# Patient Record
Sex: Male | Born: 2000 | Race: White | Hispanic: Yes | Marital: Single | Smoking: Never smoker
Health system: Southern US, Community
[De-identification: ages and names within clinical notes are randomized; demographics above are authoritative.]

## PROBLEM LIST (undated history)

## (undated) DIAGNOSIS — K3580 Unspecified acute appendicitis: Secondary | ICD-10-CM

## (undated) DIAGNOSIS — J45909 Unspecified asthma, uncomplicated: Secondary | ICD-10-CM

## (undated) DIAGNOSIS — J309 Allergic rhinitis, unspecified: Secondary | ICD-10-CM

## (undated) HISTORY — DX: Unspecified acute appendicitis: K35.80

## (undated) HISTORY — DX: Unspecified asthma, uncomplicated: J45.909

## (undated) HISTORY — PX: APPENDECTOMY: SHX54

## (undated) HISTORY — DX: Allergic rhinitis, unspecified: J30.9

---
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
## 2022-02-06 ENCOUNTER — Ambulatory Visit: Payer: Medicaid Other | Admitting: Pediatrics

## 2022-02-19 DIAGNOSIS — R051 Acute cough: Secondary | ICD-10-CM | POA: Diagnosis not present

## 2022-02-19 DIAGNOSIS — J209 Acute bronchitis, unspecified: Secondary | ICD-10-CM | POA: Diagnosis not present

## 2022-03-06 ENCOUNTER — Ambulatory Visit: Payer: Medicaid Other | Admitting: Student

## 2022-04-20 ENCOUNTER — Ambulatory Visit
Admission: RE | Admit: 2022-04-20 | Discharge: 2022-04-20 | Disposition: A | Discharge status: Home / Self Care | Payer: Medicaid Other | Source: Ambulatory Visit | Attending: Urgent Care | Admitting: Urgent Care

## 2022-04-20 VITALS — BP 133/68 | HR 68 | Temp 97.9°F | Resp 20

## 2022-04-20 DIAGNOSIS — J309 Allergic rhinitis, unspecified: Secondary | ICD-10-CM

## 2022-04-20 DIAGNOSIS — M542 Cervicalgia: Secondary | ICD-10-CM

## 2022-04-20 DIAGNOSIS — R070 Pain in throat: Secondary | ICD-10-CM | POA: Insufficient documentation

## 2022-04-20 LAB — POCT RAPID STREP A (OFFICE): Rapid Strep A Screen: NEGATIVE

## 2022-04-20 LAB — POCT MONO SCREEN (KUC): Mono, POC: NEGATIVE

## 2022-04-20 MED ORDER — PSEUDOEPHEDRINE HCL 30 MG PO TABS: 30.0000 mg | ORAL_TABLET | Freq: Three times a day (TID) | ORAL | 0 refills | Status: AC | PRN

## 2022-04-20 MED ORDER — NAPROXEN 375 MG PO TABS: 375.0000 mg | ORAL_TABLET | Freq: Two times a day (BID) | ORAL | 0 refills | Status: AC

## 2022-04-20 MED ORDER — CETIRIZINE HCL 10 MG PO TABS: 10.0000 mg | ORAL_TABLET | Freq: Every day | ORAL | 0 refills | Status: AC

## 2022-04-20 MED ORDER — TIZANIDINE HCL 4 MG PO TABS: 4.0000 mg | ORAL_TABLET | Freq: Every day | ORAL | 0 refills | Status: AC

## 2022-04-20 NOTE — ED Provider Notes (Signed)
[facility]   MRN: [id number] DOB: Jun 09, 2001  Subjective:   Dennis Clarke is a 21 y.o. male presenting for 1 week history of persistent throat pain, painful swallowing, neck pain.  No fever, runny or stuffy nose, ear pain, cough, chest pain, shortness of breath or wheezing, rashes, body pains.  Patient has a history of allergic rhinitis and asthma.  He is not a smoker.   Current Facility-Administered Medications:    ibuprofen (ADVIL) tablet 400 mg, 400 mg, Oral, Q6H PRN, Phillippe, Holly, MD  Current Outpatient Medications:    albuterol (VENTOLIN HFA) 108 (90 Base) MCG/ACT inhaler, Inhale 2 puffs into the lungs every 4 (four) hours as needed for wheezing or shortness of breath., Disp: 8 g, Rfl: 2   cetirizine (ZYRTEC) 10 MG tablet, Take 1 tablet (10 mg total) by mouth daily., Disp: 30 tablet, Rfl: 6   Cholecalciferol (VITAMIN D) 2000 units CAPS, Take 1 capsule (2,000 Units total) by mouth daily. Start after 6 weeks of high dose Vitamin D, Disp: 31 capsule, Rfl: 3   fluticasone (FLONASE) 50 MCG/ACT nasal spray, Place 2 sprays into both nostrils daily., Disp: 16 g, Rfl: 12   ibuprofen (ADVIL,MOTRIN) 200 MG tablet, Take 200 mg by mouth every 6 (six) hours as needed., Disp: , Rfl:    Vitamin D, Ergocalciferol, (DRISDOL) 50000 units CAPS capsule, Take 1 capsule (50,000 Units total) by mouth every 7 (seven) days. (Patient not taking: Reported on 12/03/2021), Disp: 6 capsule, Rfl: 0   No Known Allergies  Past Medical History:  Diagnosis Date   Abdominal pain 05/29/2017   Acute appendicitis 10/23/2014   Adjustment disorder with mixed anxiety and depressed mood 06/16/2019   Allergic rhinitis 08/24/2013   Avulsion fracture of bone 05/15/2016   Community acquired pneumonia 09/05/2017   Cough 09/05/2017   Mood disturbance 05/31/2019   Poor sleep hygiene 06/29/2018   Sebaceous cyst of axilla 01/18/2021   Tension headache 05/29/2017   Testicular mass 03/30/2019    Unspecified asthma(493.90) 08/24/2013   Wheezing 09/05/2017     Past Surgical History:  Procedure Laterality Date   APPENDECTOMY N/A    Phreesia 06/18/2020   LAPAROSCOPIC APPENDECTOMY N/A 10/23/2014   Procedure: APPENDECTOMY LAPAROSCOPIC;  Surgeon: Judie Petit. Leonia Corona, MD;  Location: [facility] OR;  Service: Pediatrics;  Laterality: N/A;    Family History  Problem Relation Age of Onset   Diabetes Maternal Uncle     Social History   Tobacco Use   Smoking status: Never   Smokeless tobacco: Never    ROS   Objective:   Vitals: BP 133/68   Pulse 68   Temp 97.9 F (36.6 C)   Resp 20   SpO2 98%   Physical Exam Constitutional:      General: He is not in acute distress.    Appearance: Normal appearance. He is normal weight. He is not ill-appearing, toxic-appearing or diaphoretic.  HENT:     Head: Normocephalic and atraumatic.     Right Ear: Tympanic membrane, ear canal and external ear normal. There is no impacted cerumen.     Left Ear: Tympanic membrane, ear canal and external ear normal. There is no impacted cerumen.     Nose: Nose normal. No congestion or rhinorrhea.     Mouth/Throat:     Mouth: Mucous membranes are moist.     Pharynx: Pharyngeal swelling (chronic tonsillar hypertrophy) present. No oropharyngeal exudate, posterior oropharyngeal erythema or uvula swelling.     Tonsils: No tonsillar  exudate or tonsillar abscesses. 0 on the right. 0 on the left.  Eyes:     General: No scleral icterus.       Right eye: No discharge.        Left eye: No discharge.     Extraocular Movements: Extraocular movements intact.     Conjunctiva/sclera: Conjunctivae normal.  Neck:     Comments: Negative Kernig and Brudzinski. Cardiovascular:     Rate and Rhythm: Normal rate.  Pulmonary:     Effort: Pulmonary effort is normal.  Musculoskeletal:     Cervical back: Normal range of motion and neck supple. No rigidity or tenderness. No muscular tenderness.  Lymphadenopathy:     Head:      Right side of head: No submental, submandibular, tonsillar, preauricular, posterior auricular or occipital adenopathy.     Left side of head: No submental, submandibular, tonsillar, preauricular, posterior auricular or occipital adenopathy.     Cervical: No cervical adenopathy.     Right cervical: No superficial, deep or posterior cervical adenopathy.    Left cervical: No superficial, deep or posterior cervical adenopathy.     Upper Body:     Right upper body: No supraclavicular adenopathy.     Left upper body: No supraclavicular adenopathy.  Neurological:     General: No focal deficit present.     Mental Status: He is alert and oriented to person, place, and time.  Psychiatric:        Mood and Affect: Mood normal.        Behavior: Behavior normal.     Results for orders placed or performed during the hospital encounter of 04/20/22 (from the past 24 hour(s))  POCT rapid strep A     Status: None   Collection Time: 04/20/22  1:23 PM  Result Value Ref Range   Rapid Strep A Screen Negative Negative  POCT mono screen     Status: None   Collection Time: 04/20/22  1:37 PM  Result Value Ref Range   Mono, POC Negative Negative    Assessment and Plan :   PDMP not reviewed this encounter.  1. Throat pain   2. Neck pain   3. Allergic rhinitis, unspecified seasonality, unspecified trigger    Patient's primary concern was meningitis after he did a Therapist, art and this came out.  I reassured patient that I have very low suspicion for him having meningitis.  Recommended conservative management, supportive care.  Strep culture pending. Counseled patient on potential for adverse effects with medications prescribed/recommended today, ER and return-to-clinic precautions discussed, patient verbalized understanding.    Wallis Bamberg, PA-C 04/20/22 1426

## 2022-04-20 NOTE — ED Triage Notes (Signed)
Pt here with neck pain and sore throat x 1 week.

## 2022-04-22 LAB — CULTURE, GROUP A STREP (THRC)

## 2022-04-30 ENCOUNTER — Ambulatory Visit
Admission: RE | Admit: 2022-04-30 | Discharge: 2022-04-30 | Disposition: A | Discharge status: Home / Self Care | Payer: Medicaid Other | Source: Ambulatory Visit | Attending: Urgent Care | Admitting: Urgent Care

## 2022-04-30 VITALS — BP 124/78 | HR 74 | Temp 97.8°F | Resp 20

## 2022-04-30 DIAGNOSIS — H938X3 Other specified disorders of ear, bilateral: Secondary | ICD-10-CM

## 2022-04-30 DIAGNOSIS — J309 Allergic rhinitis, unspecified: Secondary | ICD-10-CM

## 2022-04-30 DIAGNOSIS — R070 Pain in throat: Secondary | ICD-10-CM

## 2022-04-30 DIAGNOSIS — R0982 Postnasal drip: Secondary | ICD-10-CM

## 2022-04-30 DIAGNOSIS — J018 Other acute sinusitis: Secondary | ICD-10-CM | POA: Diagnosis not present

## 2022-04-30 MED ORDER — AMOXICILLIN-POT CLAVULANATE 875-125 MG PO TABS: 1.0000 | ORAL_TABLET | Freq: Two times a day (BID) | ORAL | 0 refills | Status: DC

## 2022-04-30 NOTE — ED Provider Notes (Signed)
[facility]   MRN: [id number] DOB: [birth year]  Subjective:   Dennis Clarke is a 21 y.o. male presenting for recheck on persistent throat pain.  Patient has also had painful swallowing, sinus fullness and pain, bilateral ear fullness.  Has a history of allergic rhinitis and has been taking Flonase, Zyrtec, pseudoephedrine.  Has had a mild cough.  No chest pain, shortness of breath or wheezing.   Current Facility-Administered Medications:    ibuprofen (ADVIL) tablet 400 mg, 400 mg, Oral, Q6H PRN, Phillippe, Holly, MD  Current Outpatient Medications:    albuterol (VENTOLIN HFA) 108 (90 Base) MCG/ACT inhaler, Inhale 2 puffs into the lungs every 4 (four) hours as needed for wheezing or shortness of breath., Disp: 8 g, Rfl: 2   cetirizine (ZYRTEC) 10 MG tablet, Take 1 tablet (10 mg total) by mouth daily., Disp: 30 tablet, Rfl: 0   Cholecalciferol (VITAMIN D) 2000 units CAPS, Take 1 capsule (2,000 Units total) by mouth daily. Start after 6 weeks of high dose Vitamin D, Disp: 31 capsule, Rfl: 3   fluticasone (FLONASE) 50 MCG/ACT nasal spray, Place 2 sprays into both nostrils daily., Disp: 16 g, Rfl: 12   ibuprofen (ADVIL,MOTRIN) 200 MG tablet, Take 200 mg by mouth every 6 (six) hours as needed., Disp: , Rfl:    naproxen (NAPROSYN) 375 MG tablet, Take 1 tablet (375 mg total) by mouth 2 (two) times daily with a meal., Disp: 30 tablet, Rfl: 0   pseudoephedrine (SUDAFED) 30 MG tablet, Take 1 tablet (30 mg total) by mouth every 8 (eight) hours as needed for congestion., Disp: 30 tablet, Rfl: 0   tiZANidine (ZANAFLEX) 4 MG tablet, Take 1 tablet (4 mg total) by mouth at bedtime., Disp: 30 tablet, Rfl: 0   Vitamin D, Ergocalciferol, (DRISDOL) 50000 units CAPS capsule, Take 1 capsule (50,000 Units total) by mouth every 7 (seven) days. (Patient not taking: Reported on 12/03/2021), Disp: 6 capsule, Rfl: 0   No Known Allergies  Past Medical History:  Diagnosis Date    Abdominal pain 05/29/2017   Acute appendicitis 10/23/2014   Adjustment disorder with mixed anxiety and depressed mood 06/16/2019   Allergic rhinitis 08/24/2013   Avulsion fracture of bone 05/15/2016   Community acquired pneumonia 09/05/2017   Cough 09/05/2017   Mood disturbance 05/31/2019   Poor sleep hygiene 06/29/2018   Sebaceous cyst of axilla 01/18/2021   Tension headache 05/29/2017   Testicular mass 03/30/2019   Unspecified asthma(493.90) 08/24/2013   Wheezing 09/05/2017     Past Surgical History:  Procedure Laterality Date   APPENDECTOMY N/A    Phreesia 06/18/2020   LAPAROSCOPIC APPENDECTOMY N/A 10/23/2014   Procedure: APPENDECTOMY LAPAROSCOPIC;  Surgeon: Judie Petit. Leonia Corona, MD;  Location: [facility] OR;  Service: Pediatrics;  Laterality: N/A;    Family History  Problem Relation Age of Onset   Diabetes Maternal Uncle     Social History   Tobacco Use   Smoking status: Never   Smokeless tobacco: Never    ROS   Objective:   Vitals: BP 124/78   Pulse 74   Temp 97.8 F (36.6 C) (Oral)   Resp 20   SpO2 98%   Physical Exam Constitutional:      General: He is not in acute distress.    Appearance: Normal appearance. He is well-developed and normal weight. He is not ill-appearing, toxic-appearing or diaphoretic.  HENT:     Head: Normocephalic and atraumatic.     Right Ear: Tympanic membrane, ear  canal and external ear normal. There is no impacted cerumen.     Left Ear: Tympanic membrane, ear canal and external ear normal. There is no impacted cerumen.     Nose: Congestion present. No rhinorrhea.     Mouth/Throat:     Mouth: Mucous membranes are moist.     Pharynx: No oropharyngeal exudate or posterior oropharyngeal erythema.     Comments: Significant postnasal drainage overlying pharynx. Eyes:     General: No scleral icterus.       Right eye: No discharge.        Left eye: No discharge.     Extraocular Movements: Extraocular movements intact.     Conjunctiva/sclera:  Conjunctivae normal.  Cardiovascular:     Rate and Rhythm: Normal rate.  Pulmonary:     Effort: Pulmonary effort is normal.  Musculoskeletal:     Cervical back: Normal range of motion and neck supple. No rigidity. No muscular tenderness.  Skin:    General: Skin is warm and dry.  Neurological:     General: No focal deficit present.     Mental Status: He is alert and oriented to person, place, and time.  Psychiatric:        Mood and Affect: Mood normal.        Behavior: Behavior normal.        Thought Content: Thought content normal.        Judgment: Judgment normal.     Assessment and Plan :   PDMP not reviewed this encounter.  1. Acute non-recurrent sinusitis of other sinus   2. Post-nasal drainage   3. Throat pain   4. Ear fullness, bilateral   5. Allergic rhinitis, unspecified seasonality, unspecified trigger    Will start empiric treatment for sinusitis with Augmentin.  Recommended supportive care otherwise including the use of oral antihistamine, decongestant.  Hold Flonase for now.    If patient's symptoms persist in the next 2 to 3 days, recommend oral prednisone course to address his allergic rhinitis.  Counseled patient on potential for adverse effects with medications prescribed/recommended today, ER and return-to-clinic precautions discussed, patient verbalized understanding.    Wallis Bamberg, [place] 04/30/22 1545

## 2022-04-30 NOTE — ED Triage Notes (Signed)
Pt here with persistent sore throat x 3 weeks.

## 2022-06-03 DIAGNOSIS — J029 Acute pharyngitis, unspecified: Secondary | ICD-10-CM | POA: Diagnosis not present

## 2022-11-06 DIAGNOSIS — R509 Fever, unspecified: Secondary | ICD-10-CM | POA: Diagnosis not present

## 2022-11-06 DIAGNOSIS — J069 Acute upper respiratory infection, unspecified: Secondary | ICD-10-CM | POA: Diagnosis not present

## 2023-05-06 DIAGNOSIS — J029 Acute pharyngitis, unspecified: Secondary | ICD-10-CM | POA: Diagnosis not present

## 2023-05-06 DIAGNOSIS — R051 Acute cough: Secondary | ICD-10-CM | POA: Diagnosis not present

## 2023-05-06 DIAGNOSIS — B349 Viral infection, unspecified: Secondary | ICD-10-CM | POA: Diagnosis not present

## 2023-06-09 DIAGNOSIS — R55 Syncope and collapse: Secondary | ICD-10-CM | POA: Diagnosis not present

## 2023-06-09 DIAGNOSIS — S0990XA Unspecified injury of head, initial encounter: Secondary | ICD-10-CM | POA: Diagnosis not present

## 2023-08-03 ENCOUNTER — Ambulatory Visit
Admission: EM | Admit: 2023-08-03 | Discharge: 2023-08-03 | Disposition: A | Discharge status: Home / Self Care | Payer: Medicaid Other | Attending: Internal Medicine | Admitting: Internal Medicine

## 2023-08-03 DIAGNOSIS — J029 Acute pharyngitis, unspecified: Secondary | ICD-10-CM | POA: Insufficient documentation

## 2023-08-03 DIAGNOSIS — Z20828 Contact with and (suspected) exposure to other viral communicable diseases: Secondary | ICD-10-CM | POA: Insufficient documentation

## 2023-08-03 LAB — POCT MONO SCREEN (KUC): Mono, POC: NEGATIVE

## 2023-08-03 LAB — POC COVID19/FLU A&B COMBO
Covid Antigen, POC: NEGATIVE
Influenza A Antigen, POC: NEGATIVE
Influenza B Antigen, POC: NEGATIVE

## 2023-08-03 LAB — POCT RAPID STREP A (OFFICE): Rapid Strep A Screen: NEGATIVE

## 2023-08-03 MED ORDER — AMOXICILLIN 500 MG PO CAPS: 500.0000 mg | ORAL_CAPSULE | Freq: Two times a day (BID) | ORAL | 0 refills | Status: AC

## 2023-08-03 NOTE — ED Triage Notes (Signed)
Pt presents to UC w/ c/o sore throat x2 weeks.  Home remedies: cough drops, cold medicine

## 2023-08-03 NOTE — ED Provider Notes (Signed)
[facility]    CSN: [id number] Arrival date & time: 08/03/23  1153      History   Chief Complaint No chief complaint on file.   HPI Dennis Clarke is a 22 y.o. male  presents for evaluation of URI symptoms for 2 weeks. Patient reports associated symptoms of sore throat with some throat congestion, and fatigue.  Denies N/V/D, fever, cough, ear pain, body aches, shortness of breath. Patient does have a hx of asthma.  Denies any wheezing or shortness of breath and has not needed to use his albuterol inhaler.  Patient does not at have a history of smoking.  Reports  sick contacts via girlfriend who has the flu.  Pt has taken cold medicine OTC for symptoms. Pt has no other concerns at this time.   HPI  Past Medical History:  Diagnosis Date   Abdominal pain 05/29/2017   Acute appendicitis 10/23/2014   Adjustment disorder with mixed anxiety and depressed mood 06/16/2019   Allergic rhinitis 08/24/2013   Avulsion fracture of bone 05/15/2016   Community acquired pneumonia 09/05/2017   Cough 09/05/2017   Mood disturbance 05/31/2019   Poor sleep hygiene 06/29/2018   Sebaceous cyst of axilla 01/18/2021   Tension headache 05/29/2017   Testicular mass 03/30/2019   Unspecified asthma(493.90) 08/24/2013   Wheezing 09/05/2017    Patient Active Problem List   Diagnosis Date Noted   Intermittent asthma 05/29/2017   Anxiety state 05/29/2017   Allergic rhinitis 08/24/2013    Past Surgical History:  Procedure Laterality Date   APPENDECTOMY N/A    Phreesia 06/18/2020   LAPAROSCOPIC APPENDECTOMY N/A 10/23/2014   Procedure: APPENDECTOMY LAPAROSCOPIC;  Surgeon: Judie Petit. Leonia Corona, MD;  Location: [facility] OR;  Service: Pediatrics;  Laterality: N/A;       Home Medications    Prior to Admission medications   Medication Sig Start Date End Date Taking? Authorizing Provider  amoxicillin (AMOXIL) 500 MG capsule Take 1 capsule (500 mg total) by mouth 2 (two) times daily for 10 days.  08/03/23 08/13/23 Yes Radford Pax, NP  albuterol (VENTOLIN HFA) 108 (90 Base) MCG/ACT inhaler Inhale 2 puffs into the lungs every 4 (four) hours as needed for wheezing or shortness of breath. 12/03/21   Ben-Davies, Kathyrn Sheriff, MD  cetirizine (ZYRTEC) 10 MG tablet Take 1 tablet (10 mg total) by mouth daily. 04/20/22   Wallis Bamberg, PA-C  Cholecalciferol (VITAMIN D) 2000 units CAPS Take 1 capsule (2,000 Units total) by mouth daily. Start after 6 weeks of high dose Vitamin D 06/30/18   Simha, Shruti V, MD  fluticasone (FLONASE) 50 MCG/ACT nasal spray Place 2 sprays into both nostrils daily. 11/08/21   Marijo File, MD  ibuprofen (ADVIL,MOTRIN) 200 MG tablet Take 200 mg by mouth every 6 (six) hours as needed.    [provider]  naproxen (NAPROSYN) 375 MG tablet Take 1 tablet (375 mg total) by mouth 2 (two) times daily with a meal. 04/20/22   Wallis Bamberg, PA-C  pseudoephedrine (SUDAFED) 30 MG tablet Take 1 tablet (30 mg total) by mouth every 8 (eight) hours as needed for congestion. 04/20/22   Wallis Bamberg, PA-C  tiZANidine (ZANAFLEX) 4 MG tablet Take 1 tablet (4 mg total) by mouth at bedtime. 04/20/22   Wallis Bamberg, PA-C  Vitamin D, Ergocalciferol, (DRISDOL) 50000 units CAPS capsule Take 1 capsule (50,000 Units total) by mouth every 7 (seven) days. Patient not taking: Reported on 12/03/2021 07/09/18   Jonetta Osgood, MD  Family History Family History  Problem Relation Age of Onset   Diabetes Maternal Uncle     Social History Social History   Tobacco Use   Smoking status: Never   Smokeless tobacco: Never  Vaping Use   Vaping status: Never Used     Allergies   Patient has no known allergies.   Review of Systems Review of Systems  Constitutional:  Positive for fatigue.  HENT:  Positive for sore throat.      Physical Exam Triage Vital Signs ED Triage Vitals  Encounter Vitals Group     BP 08/03/23 1211 132/88     Systolic BP Percentile --      Diastolic BP Percentile --       Pulse Rate 08/03/23 1211 76     Resp 08/03/23 1211 16     Temp 08/03/23 1211 98 F (36.7 C)     Temp Source 08/03/23 1211 Oral     SpO2 08/03/23 1211 97 %     Weight --      Height --      Head Circumference --      Peak Flow --      Pain Score 08/03/23 1209 6     Pain Loc --      Pain Education --      Exclude from Growth Chart --    No data found.  Updated Vital Signs BP 132/88 (BP Location: Right Arm)   Pulse 76   Temp 98 F (36.7 C) (Oral)   Resp 16   SpO2 97%   Visual Acuity Right Eye Distance:   Left Eye Distance:   Bilateral Distance:    Right Eye Near:   Left Eye Near:    Bilateral Near:     Physical Exam Vitals and nursing note reviewed.  Constitutional:      General: He is not in acute distress.    Appearance: Normal appearance. He is not ill-appearing or toxic-appearing.  HENT:     Head: Normocephalic and atraumatic.     Right Ear: Tympanic membrane and ear canal normal.     Left Ear: Tympanic membrane and ear canal normal.     Nose: No congestion.     Mouth/Throat:     Mouth: Mucous membranes are moist.     Pharynx: Posterior oropharyngeal erythema present.  Eyes:     Pupils: Pupils are equal, round, and reactive to light.  Cardiovascular:     Rate and Rhythm: Normal rate and regular rhythm.     Heart sounds: Normal heart sounds.  Pulmonary:     Effort: Pulmonary effort is normal.     Breath sounds: Normal breath sounds.  Musculoskeletal:     Cervical back: Normal range of motion and neck supple.  Lymphadenopathy:     Cervical: No cervical adenopathy.  Skin:    General: Skin is warm and dry.  Neurological:     General: No focal deficit present.     Mental Status: He is alert and oriented to person, place, and time.  Psychiatric:        Mood and Affect: Mood normal.        Behavior: Behavior normal.      UC Treatments / Results  Labs (all labs ordered are listed, but only abnormal results are displayed) Labs Reviewed  CULTURE,  GROUP A STREP [facility])  POCT RAPID STREP A (OFFICE)  POCT MONO SCREEN ([facility])  POC COVID19/FLU A&B COMBO    EKG  Radiology No results found.  Procedures Procedures (including critical care time)  Medications Ordered in UC Medications - No data to display  Initial Impression / Assessment and Plan / UC Course  I have reviewed the triage vital signs and the nursing notes.  Pertinent labs & imaging results that were available during my care of the patient were reviewed by me and considered in my medical decision making (see chart for details).     Reviewed exam and symptoms with patient.  No red flags.  Negative rapid strep, flu, COVID, and mono.  Will send throat culture.  Given length of symptoms we will start amoxicillin.  PCP follow-up if symptoms do not improve.  ER precautions reviewed. Final Clinical Impressions(s) / UC Diagnoses   Final diagnoses:  Exposure to the flu  Sore throat  Acute pharyngitis, unspecified etiology     Discharge Instructions      The clinic will contact you with results of the strep throat culture done today if positive.  Start amoxicillin twice daily for 10 days.  Lots of rest and fluids.  You may do salt water gargles and warm liquids such as teas and honey.  Over-the-counter ibuprofen or Tylenol as needed.  Please follow-up with your PCP if your symptoms do not improve.  Please go to the ER for any worsening symptoms.  I hope you feel better soon!     ED Prescriptions     Medication Sig Dispense Auth. Provider   amoxicillin (AMOXIL) 500 MG capsule Take 1 capsule (500 mg total) by mouth 2 (two) times daily for 10 days. 20 capsule Radford Pax, NP      PDMP not reviewed this encounter.   Radford Pax, NP 08/03/23 1259

## 2023-08-03 NOTE — Discharge Instructions (Signed)
The clinic will contact you with results of the strep throat culture done today if positive.  Start amoxicillin twice daily for 10 days.  Lots of rest and fluids.  You may do salt water gargles and warm liquids such as teas and honey.  Over-the-counter ibuprofen or Tylenol as needed.  Please follow-up with your PCP if your symptoms do not improve.  Please go to the ER for any worsening symptoms.  I hope you feel better soon!

## 2023-08-06 LAB — CULTURE, GROUP A STREP (THRC)

## 2023-09-26 DIAGNOSIS — D2272 Melanocytic nevi of left lower limb, including hip: Secondary | ICD-10-CM | POA: Diagnosis not present

## 2023-10-31 DIAGNOSIS — J029 Acute pharyngitis, unspecified: Secondary | ICD-10-CM | POA: Diagnosis not present

## 2023-10-31 DIAGNOSIS — R070 Pain in throat: Secondary | ICD-10-CM | POA: Diagnosis not present

## 2023-10-31 DIAGNOSIS — J069 Acute upper respiratory infection, unspecified: Secondary | ICD-10-CM | POA: Diagnosis not present

## 2024-06-28 DIAGNOSIS — M26629 Arthralgia of temporomandibular joint, unspecified side: Secondary | ICD-10-CM | POA: Diagnosis not present

## 2024-08-02 DIAGNOSIS — M26621 Arthralgia of right temporomandibular joint: Secondary | ICD-10-CM | POA: Diagnosis not present
